# Patient Record
Sex: Male | Born: 1960 | Race: White | Hispanic: No | Marital: Married | Smoking: Never smoker
Health system: Southern US, Community
[De-identification: ages and names within clinical notes are randomized; demographics above are authoritative.]

---

## 2000-07-10 ENCOUNTER — Encounter: Payer: Self-pay | Admitting: Surgery

## 2000-07-10 ENCOUNTER — Encounter: Admission: RE | Admit: 2000-07-10 | Discharge: 2000-07-10 | Payer: Self-pay | Admitting: Surgery

## 2007-12-09 ENCOUNTER — Encounter: Admission: RE | Admit: 2007-12-09 | Discharge: 2007-12-09 | Payer: Self-pay | Admitting: Family Medicine

## 2008-07-24 ENCOUNTER — Emergency Department (HOSPITAL_COMMUNITY): Admission: EM | Admit: 2008-07-24 | Discharge: 2008-07-24 | Payer: Self-pay | Admitting: Emergency Medicine

## 2010-08-12 ENCOUNTER — Encounter: Payer: Self-pay | Admitting: Family Medicine

## 2010-08-13 ENCOUNTER — Encounter: Admission: RE | Admit: 2010-08-13 | Discharge: 2010-08-13 | Payer: Self-pay | Admitting: Family Medicine

## 2010-08-29 ENCOUNTER — Encounter: Admission: RE | Admit: 2010-08-29 | Discharge: 2010-08-29 | Payer: Self-pay | Admitting: Neurosurgery

## 2010-09-16 ENCOUNTER — Encounter: Admission: RE | Admit: 2010-09-16 | Discharge: 2010-09-16 | Payer: Self-pay | Admitting: Neurosurgery

## 2010-10-19 ENCOUNTER — Ambulatory Visit (HOSPITAL_COMMUNITY)
Admission: RE | Admit: 2010-10-19 | Discharge: 2010-10-19 | Payer: Self-pay | Source: Home / Self Care | Attending: Neurosurgery | Admitting: Neurosurgery

## 2011-03-26 IMAGING — CR DG ORBITS FOR FOREIGN BODY
2 series · 2 of 2 positions shown · non-contrast
Comparison: None

CLINICAL DATA: Pre MRI.  History of exposure to metal.

ORBITS FOR FOREIGN BODY - 2 VIEW

[view not recorded (1 of 2)]
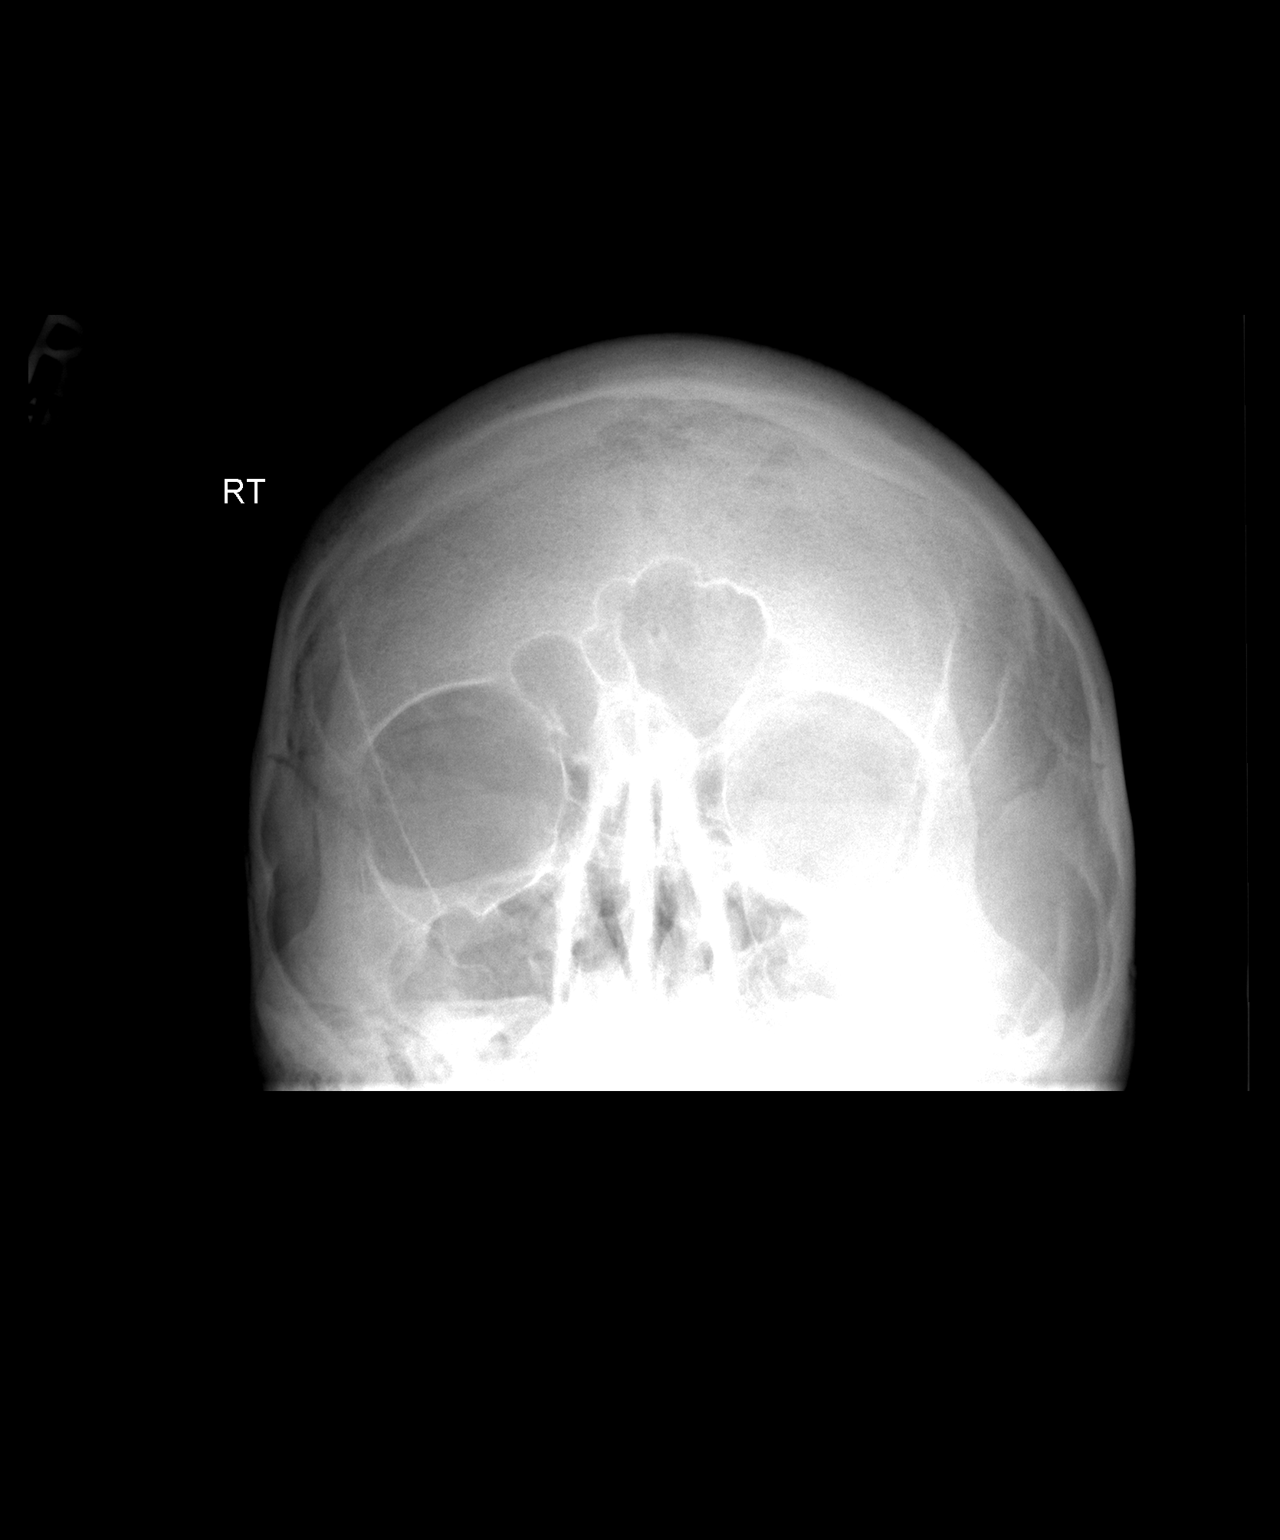

[view not recorded (2 of 2)]
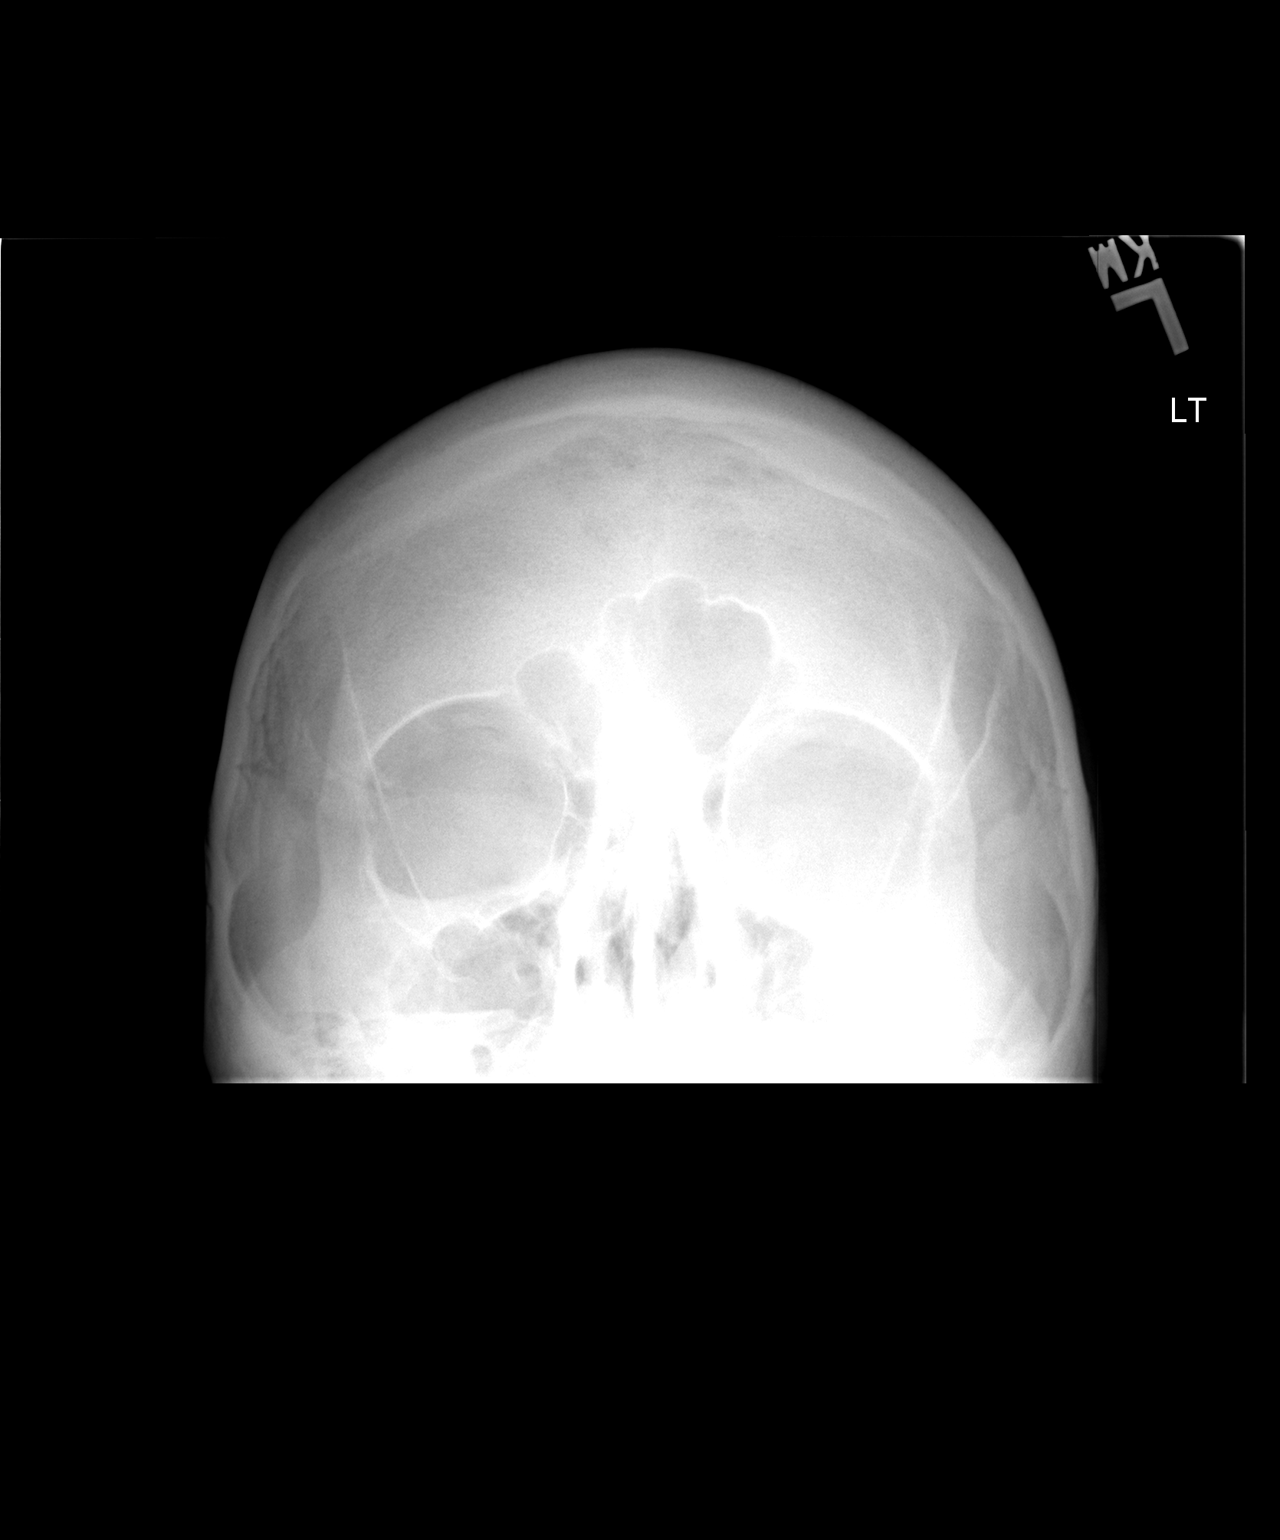

[2 of 2 positions shown; findings below may reference images not displayed]

FINDINGS: No evidence of radiopaque metallic foreign body
projecting over the orbits.  Visualized bony structures
unremarkable.
IMPRESSION: No evidence of radiopaque foreign body within the orbits.

## 2011-08-07 LAB — URINE MICROSCOPIC-ADD ON

## 2011-08-07 LAB — URINALYSIS, ROUTINE W REFLEX MICROSCOPIC
Ketones, ur: 15 — AB
Protein, ur: NEGATIVE
Specific Gravity, Urine: 1.031 — ABNORMAL HIGH
pH: 8

## 2011-08-07 LAB — CBC
HCT: 43.6
MCV: 83.6
RBC: 5.22
WBC: 5.2

## 2011-08-07 LAB — BASIC METABOLIC PANEL
Chloride: 105
GFR calc Af Amer: >60
Potassium: 4.1

## 2011-08-07 LAB — DIFFERENTIAL
Eosinophils Absolute: 0.1
Eosinophils Relative: 1
Lymphs Abs: 1.3
Monocytes Relative: 9

## 2018-08-06 ENCOUNTER — Encounter (HOSPITAL_COMMUNITY): Payer: Self-pay | Admitting: Emergency Medicine

## 2018-08-06 ENCOUNTER — Ambulatory Visit (HOSPITAL_COMMUNITY)
Admission: EM | Admit: 2018-08-06 | Discharge: 2018-08-06 | Disposition: A | Discharge status: Home / Self Care | Payer: BC Managed Care – PPO | Attending: Family Medicine | Admitting: Family Medicine

## 2018-08-06 ENCOUNTER — Other Ambulatory Visit: Payer: Self-pay

## 2018-08-06 DIAGNOSIS — H5789 Other specified disorders of eye and adnexa: Secondary | ICD-10-CM

## 2018-08-06 MED ORDER — TETRACAINE HCL 0.5 % OP SOLN
OPHTHALMIC | Status: AC
  Filled 2018-08-06: qty 4

## 2018-08-06 MED ORDER — EYE WASH OPHTH SOLN
OPHTHALMIC | Status: AC
  Filled 2018-08-06: qty 118

## 2018-08-06 NOTE — ED Triage Notes (Addendum)
Reports contact in left eye and unable to get contact out of left eye.  Contacts placed in eye this morning.  Patient has rubbed eye this morning and at that point started having pain.  Patient has seen a paramedic, has been to a fire station prior to coming to [facility] for assistance. Denies any difference in vision.  Patient is aggravated.

## 2018-08-06 NOTE — Discharge Instructions (Signed)
Head over to see Dr Baker Pierini. [address]     [place] [address]   [phone]  You are being worked in so he will get to you as soon as possible.

## 2018-08-06 NOTE — ED Provider Notes (Signed)
  [facility]    CSN: [id number] Arrival date & time: 08/06/18  1229  Chief Complaint  Patient presents with  . Eye Problem    Subjective: Patient is a 57 y.o. male here for contact lens loss.  3 hrs prior to arrival, had lost contact lens in L eye. It is irritated and painful. Went to have paramedics and fire fighters remove, but was unsuccessful. Irrigation also unsuccessful. Here to have it removed.    ROS: Eyes: As noted in HPI  History reviewed. No pertinent past medical history.  Objective: BP 130/86 (BP Location: Right Arm)   Pulse 60   Temp 97.8 F (36.6 C) (Oral)   Resp 20   SpO2 100%  General: Awake, appears stated age HEENT: Eye neg on R, injected on L, I did not appreciate any foreign body or specifically a contact lens Lungs: No accessory muscle use Psych: Age appropriate judgment and insight, normal affect and mood  Final Clinical Impressions(s) / UC Diagnoses   Final diagnoses:  Eye irritation   I cannot visualize any foreign body.  A colleague with normal line site also did not see any foreign body.  We gave him several drops of tetracaine for comfort.  I contacted Dr. Alben Spittle with the ophthalmology team regarding the case.  He instructed me to have the patient go to his office for evaluation.  The patient was given contact information for Dr. Wende Mott office and sent there.  There is a chance that the contact lens is gone. He voiced understanding and agreement to this plan.   Discharge Instructions      Head over to see Dr Baker Pierini. [address]     [place] [address]   [phone]  You are being worked in so he will get to you as soon as possible.      Sharlene Dory, DO 08/06/18 1515
